# Patient Record
Sex: Male | Born: 1990 | Race: White | Hispanic: No | Marital: Single | Smoking: Current every day smoker
Health system: Southern US, Community
[De-identification: ages and names within clinical notes are randomized; demographics above are authoritative.]

---

## 2016-09-01 ENCOUNTER — Emergency Department (HOSPITAL_COMMUNITY)
Admission: EM | Admit: 2016-09-01 | Discharge: 2016-09-01 | Disposition: A | Discharge status: Home / Self Care | Payer: Medicare Other | Attending: Emergency Medicine | Admitting: Emergency Medicine

## 2016-09-01 ENCOUNTER — Emergency Department (HOSPITAL_COMMUNITY): Payer: Medicare Other

## 2016-09-01 ENCOUNTER — Encounter (HOSPITAL_COMMUNITY): Payer: Self-pay | Admitting: Emergency Medicine

## 2016-09-01 DIAGNOSIS — S61441A Puncture wound with foreign body of right hand, initial encounter: Secondary | ICD-10-CM | POA: Diagnosis not present

## 2016-09-01 DIAGNOSIS — Y999 Unspecified external cause status: Secondary | ICD-10-CM | POA: Insufficient documentation

## 2016-09-01 DIAGNOSIS — Z23 Encounter for immunization: Secondary | ICD-10-CM | POA: Insufficient documentation

## 2016-09-01 DIAGNOSIS — S6991XA Unspecified injury of right wrist, hand and finger(s), initial encounter: Secondary | ICD-10-CM | POA: Diagnosis present

## 2016-09-01 DIAGNOSIS — Y9389 Activity, other specified: Secondary | ICD-10-CM | POA: Diagnosis not present

## 2016-09-01 DIAGNOSIS — Y929 Unspecified place or not applicable: Secondary | ICD-10-CM | POA: Insufficient documentation

## 2016-09-01 DIAGNOSIS — F17210 Nicotine dependence, cigarettes, uncomplicated: Secondary | ICD-10-CM | POA: Insufficient documentation

## 2016-09-01 DIAGNOSIS — S60551A Superficial foreign body of right hand, initial encounter: Secondary | ICD-10-CM

## 2016-09-01 MED ORDER — SULFAMETHOXAZOLE-TRIMETHOPRIM 800-160 MG PO TABS: 1.0000 | ORAL_TABLET | Freq: Two times a day (BID) | ORAL | 0 refills | Status: AC

## 2016-09-01 MED ORDER — LIDOCAINE-EPINEPHRINE-TETRACAINE (LET) SOLUTION
NASAL | Status: AC
  Filled 2016-09-01: qty 3

## 2016-09-01 MED ORDER — TETANUS-DIPHTH-ACELL PERTUSSIS 5-2.5-18.5 LF-MCG/0.5 IM SUSP
0.5000 mL | Freq: Once | INTRAMUSCULAR | Status: AC
  Administered 2016-09-01: 0.5 mL via INTRAMUSCULAR
  Filled 2016-09-01: qty 0.5

## 2016-09-01 MED ORDER — LIDOCAINE HCL (PF) 1 % IJ SOLN
INTRAMUSCULAR | Status: AC
  Filled 2016-09-01: qty 5
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
## 2016-09-01 NOTE — Discharge Instructions (Signed)
Return if any problems.
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
